# Patient Record
Sex: Male | Born: 1985 | Race: Black or African American | Hispanic: No | Marital: Married | Smoking: Never smoker
Health system: Southern US, Community
[De-identification: ages and names within clinical notes are randomized; demographics above are authoritative.]

---

## 2012-11-29 ENCOUNTER — Emergency Department: Payer: Self-pay | Admitting: Emergency Medicine

## 2012-11-29 LAB — COMPREHENSIVE METABOLIC PANEL
Anion Gap: 6 — ABNORMAL LOW (ref 7–16)
BUN: 18 mg/dL (ref 7–18)
Calcium, Total: 9.9 mg/dL (ref 8.5–10.1)
Chloride: 103 mmol/L (ref 98–107)
Co2: 27 mmol/L (ref 21–32)
Creatinine: 1.09 mg/dL (ref 0.60–1.30)
EGFR (African American): >60
EGFR (Non-African Amer.): >60
Glucose: 115 mg/dL — ABNORMAL HIGH (ref 65–99)
Osmolality: 275 (ref 275–301)
Sodium: 136 mmol/L (ref 136–145)
Total Protein: 8.7 g/dL — ABNORMAL HIGH (ref 6.4–8.2)

## 2012-11-29 LAB — CBC
MCH: 31 pg (ref 26.0–34.0)
Platelet: 218 10*3/uL (ref 150–440)
RBC: 5.46 10*6/uL (ref 4.40–5.90)
RDW: 13.7 % (ref 11.5–14.5)
WBC: 14.8 10*3/uL — ABNORMAL HIGH (ref 3.8–10.6)

## 2014-02-21 ENCOUNTER — Emergency Department: Payer: Self-pay | Admitting: Emergency Medicine

## 2014-04-08 ENCOUNTER — Emergency Department: Payer: Self-pay | Admitting: Emergency Medicine

## 2018-07-16 ENCOUNTER — Other Ambulatory Visit: Payer: Self-pay

## 2018-07-16 ENCOUNTER — Ambulatory Visit
Admission: EM | Admit: 2018-07-16 | Discharge: 2018-07-16 | Disposition: A | Discharge status: Home / Self Care | Payer: Self-pay | Attending: Family Medicine | Admitting: Family Medicine

## 2018-07-16 ENCOUNTER — Ambulatory Visit (INDEPENDENT_AMBULATORY_CARE_PROVIDER_SITE_OTHER): Payer: Self-pay

## 2018-07-16 DIAGNOSIS — S92354A Nondisplaced fracture of fifth metatarsal bone, right foot, initial encounter for closed fracture: Secondary | ICD-10-CM

## 2018-07-16 DIAGNOSIS — Y9367 Activity, basketball: Secondary | ICD-10-CM

## 2018-07-16 DIAGNOSIS — X58XXXA Exposure to other specified factors, initial encounter: Secondary | ICD-10-CM

## 2018-07-16 NOTE — ED Triage Notes (Signed)
Pt reports injuring his right foot 2.5 weeks ago playing basketball. Pain is to outer aspect. Has noticed improvement each week but pain still not gone.

## 2018-07-16 NOTE — Discharge Instructions (Signed)
Nonweightbearing.  Call podiatry tomorrow.  Take care  Dr. Adriana Simas

## 2018-07-16 NOTE — ED Provider Notes (Signed)
[facility]    CSN: [id number] Arrival date & time: 07/16/18  1500  History   Chief Complaint Chief Complaint  Patient presents with  . Foot Pain   HPI  32 year old male presents with right foot pain.  Patient reports that he injured his foot 2.5 weeks ago while playing basketball.  He stepped on another player's foot.  In doing so, he injured the lateral aspect of his right foot.  He feels like he dislocated the area and it subsequently popped back in place.  Patient endorses pain particularly at the level of the fifth MTP on the plantar aspect.  He has been slowly progressing and is now able to walk.  However, he still notes pain.  Pain is mild. No reports of swelling.  No other associated symptoms.  No other complaints.  Social hx reviewed as below. Social History Social History   Tobacco Use  . Smoking status: Never Smoker  . Smokeless tobacco: Never Used  Substance Use Topics  . Alcohol use: Never    Frequency: Never  . Drug use: Never     Allergies   Patient has no known allergies.   Review of Systems Review of Systems  Constitutional: Negative.   Musculoskeletal:       Foot pain, right.    Physical Exam Triage Vital Signs ED Triage Vitals  Enc Vitals Group     BP 07/16/18 1515 126/84     Pulse Rate 07/16/18 1515 65     Resp 07/16/18 1515 20     Temp 07/16/18 1515 98.1 F (36.7 C)     Temp Source 07/16/18 1515 Oral     SpO2 07/16/18 1515 100 %     Weight --      Height --      Head Circumference --      Peak Flow --      Pain Score 07/16/18 1512 5     Pain Loc --      Pain Edu? --      Excl. in GC? --    Updated Vital Signs BP 126/84 (BP Location: Left Arm)   Pulse 65   Temp 98.1 F (36.7 C) (Oral)   Resp 20   SpO2 100%   Visual Acuity Right Eye Distance:   Left Eye Distance:   Bilateral Distance:    Right Eye Near:   Left Eye Near:    Bilateral Near:     Physical Exam  Constitutional: He is oriented to person, place,  and time. He appears well-developed. No distress.  HENT:  Head: Normocephalic and atraumatic.  Pulmonary/Chest: Effort normal. No respiratory distress.  Musculoskeletal:       Feet:  Patient with tenderness at the labeled location.  Patient has no tenderness at the base of the fifth metatarsal.  Neurological: He is alert and oriented to person, place, and time.  Psychiatric: He has a normal mood and affect. His behavior is normal.  Nursing note and vitals reviewed.  UC Treatments / Results  Labs (all labs ordered are listed, but only abnormal results are displayed) Labs Reviewed - No data to display  EKG None  Radiology Dg Foot Complete Right  Result Date: 07/16/2018 CLINICAL DATA:  Pain lateral side of right foot along 5th toe. Felt what seemed like a dislocation 2 1/2 weeks ago, but still having pain. EXAM: RIGHT FOOT COMPLETE - 3+ VIEW COMPARISON:  None. FINDINGS: There is a transverse fracture of the proximal metaphysis of the  fifth metatarsal, which is a Jones fracture. No other fractures.  No bone lesions. The joints are normally spaced and aligned. Mild lateral soft tissue swelling. IMPRESSION: 1. Transverse, nondisplaced fracture of the proximal metaphysis of the fifth metatarsal. Recommend follow-up radiographs in 4-6 weeks to ensure healing, since these type fractures are prone to nonunion. 2. No other fractures.  No dislocation. Electronically Signed   By: Amie Portland M.D.   On: 07/16/2018 15:35    Procedures Procedures (including critical care time)  Medications Ordered in UC Medications - No data to display  Initial Impression / Assessment and Plan / UC Course  I have reviewed the triage vital signs and the nursing notes.  Pertinent labs & imaging results that were available during my care of the patient were reviewed by me and considered in my medical decision making (see chart for details).    32 year old male presents with a foot injury.  He has no evidence of  fracture at the location that he is tender.  His x-ray did reveal a nondisplaced fracture of the fifth metatarsal proximal metaphysis.  I am unsure if this is the source of his pain.  I have advised him to not bear weight.  Crutches given.  His pain is mild currently.  Advised him to contact podiatry tomorrow.  Final Clinical Impressions(s) / UC Diagnoses   Final diagnoses:  Nondisplaced fracture of fifth metatarsal bone, right foot, initial encounter for closed fracture     Discharge Instructions     Nonweightbearing.  Call podiatry tomorrow.  Take care  Dr. Adriana Simas    ED Prescriptions    None     Controlled Substance Prescriptions [place] Controlled Substance Registry consulted? Not Applicable   Tommie Sams, DO 07/16/18 1610

## 2019-05-16 IMAGING — CR DG FOOT COMPLETE 3+V*R*
3 series · 3 of 3 positions shown · non-contrast
Comparison: None.

CLINICAL DATA: Pain lateral side of right foot along 5th toe. Felt
what seemed like a dislocation 2 [DATE] weeks ago, but still having
pain.

EXAM:
RIGHT FOOT COMPLETE - 3+ VIEW

[foot ap]
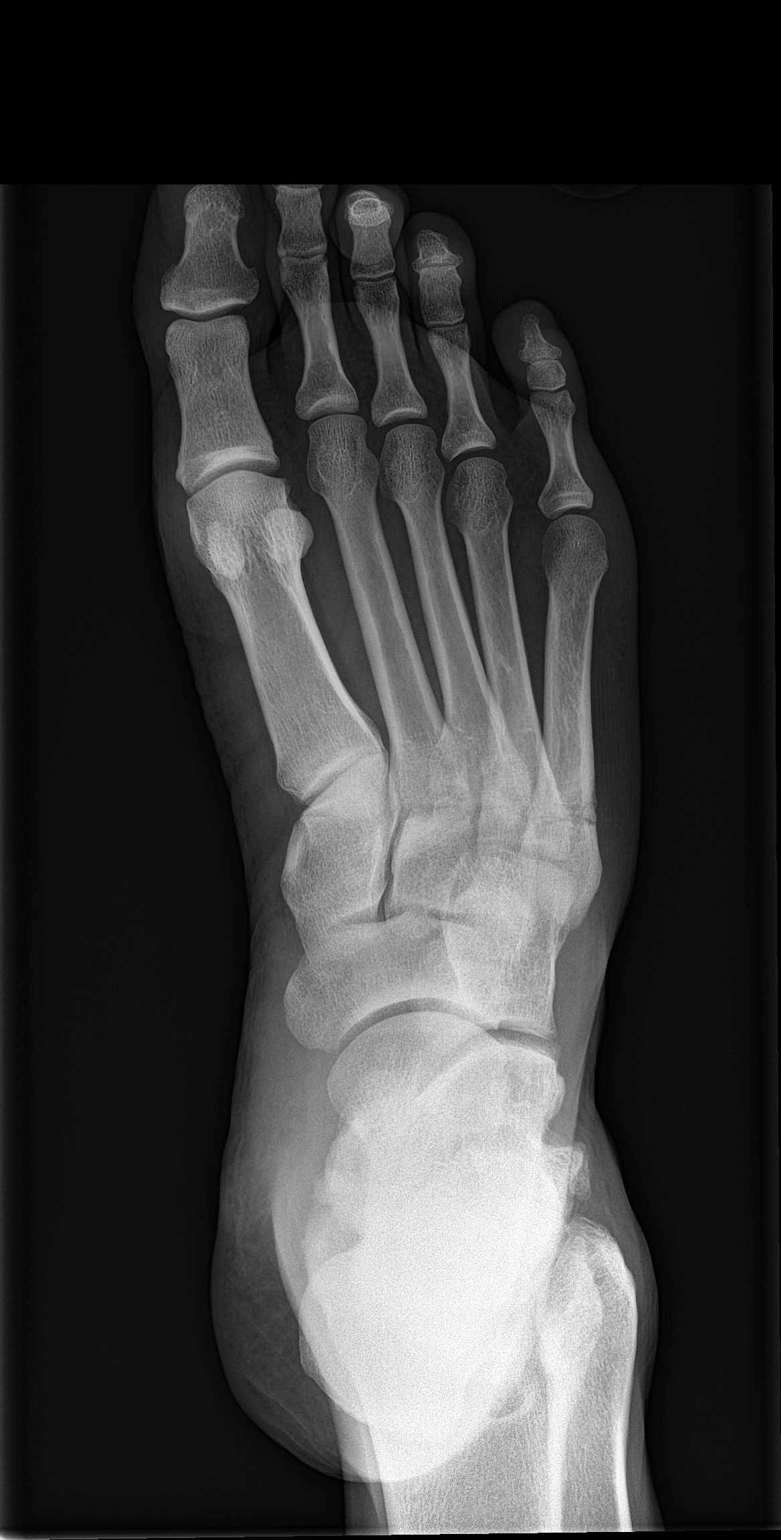

[foot obl]
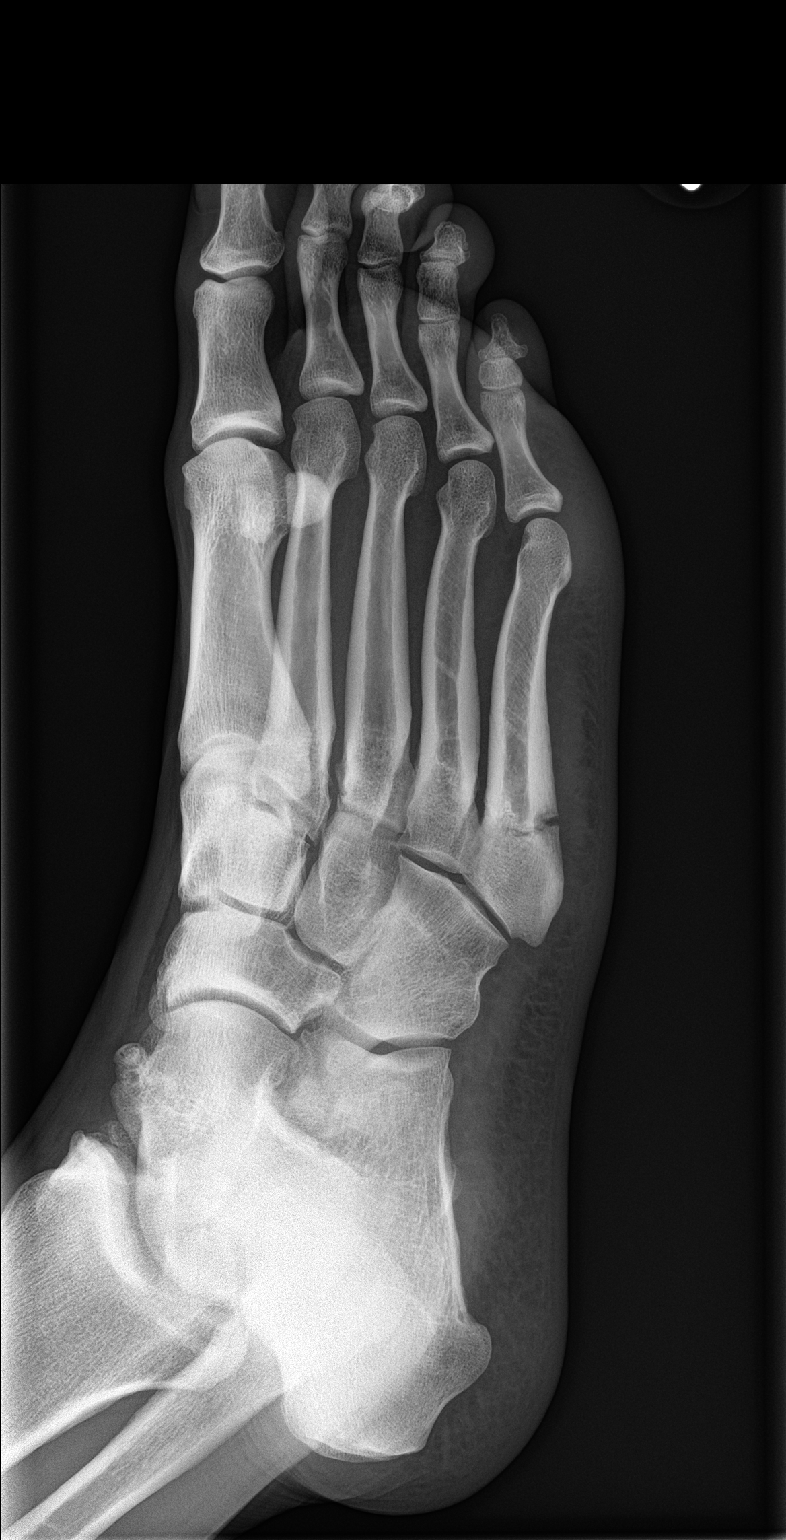

[foot lat]
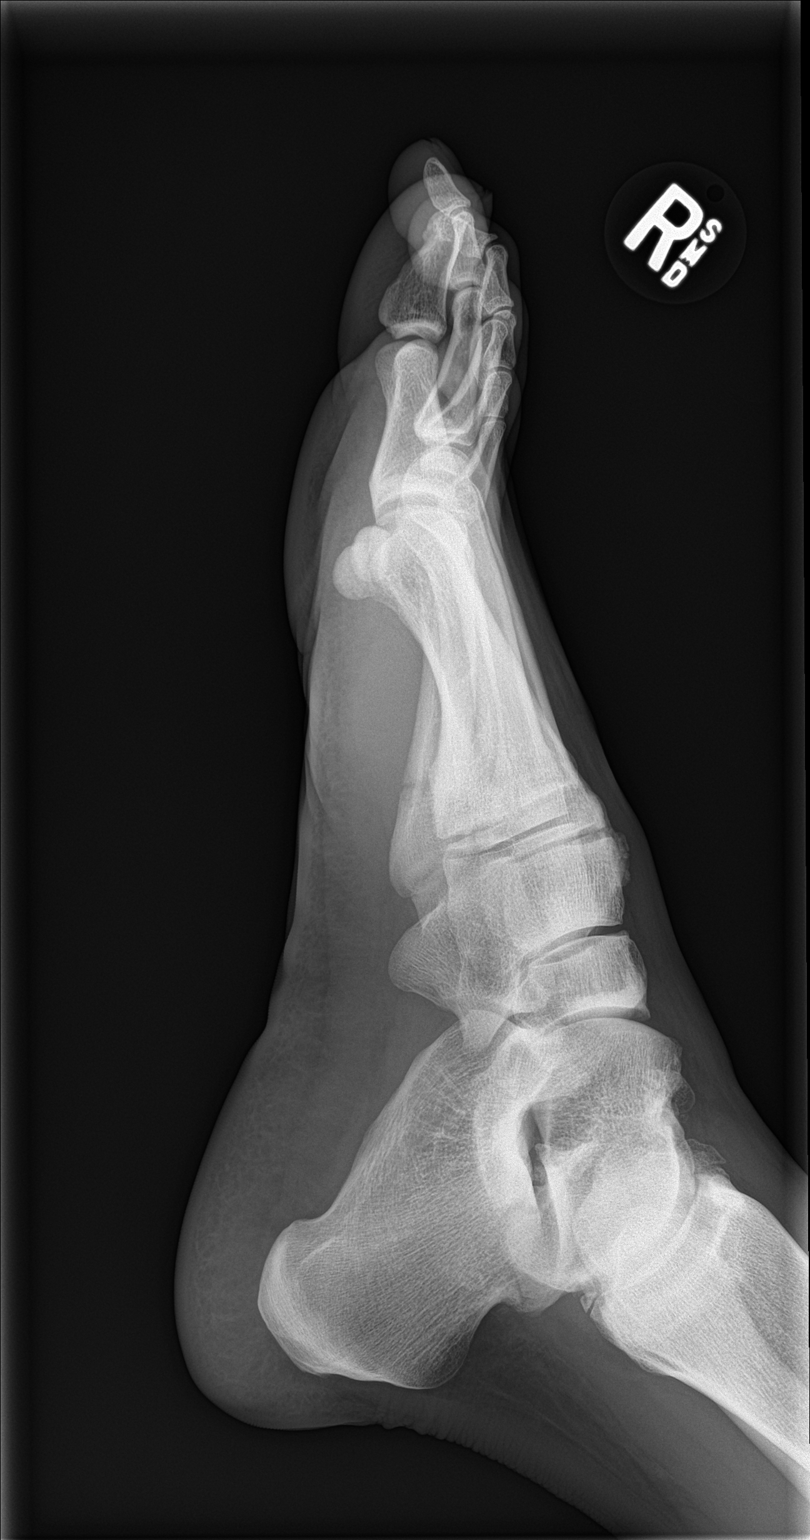

[3 of 3 positions shown; findings below may reference images not displayed]

FINDINGS: There is a transverse fracture of the proximal metaphysis of the
fifth metatarsal, which is a Jones fracture.

No other fractures.  No bone lesions.

The joints are normally spaced and aligned.

Mild lateral soft tissue swelling.
IMPRESSION: 1. Transverse, nondisplaced fracture of the proximal metaphysis of
the fifth metatarsal. Recommend follow-up radiographs in 4-6 weeks
to ensure healing, since these type fractures are prone to nonunion.
2. No other fractures.  No dislocation.
